# Patient Record
Sex: Female | Born: 1960 | Race: White | Hispanic: No | Smoking: Current every day smoker
Health system: Southern US, Community
[De-identification: ages and names within clinical notes are randomized; demographics above are authoritative.]

## PROBLEM LIST (undated history)

## (undated) DIAGNOSIS — E039 Hypothyroidism, unspecified: Secondary | ICD-10-CM

## (undated) DIAGNOSIS — N3010 Interstitial cystitis (chronic) without hematuria: Secondary | ICD-10-CM

## (undated) DIAGNOSIS — C801 Malignant (primary) neoplasm, unspecified: Secondary | ICD-10-CM

## (undated) DIAGNOSIS — C73 Malignant neoplasm of thyroid gland: Secondary | ICD-10-CM

## (undated) DIAGNOSIS — R112 Nausea with vomiting, unspecified: Secondary | ICD-10-CM

## (undated) DIAGNOSIS — Z9889 Other specified postprocedural states: Secondary | ICD-10-CM

## (undated) HISTORY — PX: BLADDER SURGERY: SHX569

## (undated) HISTORY — PX: THYROID SURGERY: SHX805

## (undated) HISTORY — PX: COLONOSCOPY: SHX174

## (undated) HISTORY — PX: CERVICAL CONIZATION W/BX: SHX1330

---

## 1998-04-02 ENCOUNTER — Ambulatory Visit (HOSPITAL_COMMUNITY): Admission: RE | Admit: 1998-04-02 | Discharge: 1998-04-02 | Payer: Self-pay | Admitting: Obstetrics & Gynecology

## 2001-04-09 ENCOUNTER — Other Ambulatory Visit: Admission: RE | Admit: 2001-04-09 | Discharge: 2001-04-09 | Payer: Self-pay | Admitting: Obstetrics & Gynecology

## 2002-06-09 ENCOUNTER — Other Ambulatory Visit: Admission: RE | Admit: 2002-06-09 | Discharge: 2002-06-09 | Payer: Self-pay | Admitting: Obstetrics & Gynecology

## 2002-12-18 ENCOUNTER — Encounter: Admission: RE | Admit: 2002-12-18 | Discharge: 2002-12-18 | Payer: Self-pay | Admitting: Family Medicine

## 2002-12-18 ENCOUNTER — Encounter: Payer: Self-pay | Admitting: Family Medicine

## 2003-01-10 ENCOUNTER — Encounter: Payer: Self-pay | Admitting: Family Medicine

## 2003-01-10 ENCOUNTER — Encounter: Admission: RE | Admit: 2003-01-10 | Discharge: 2003-01-10 | Payer: Self-pay | Admitting: Family Medicine

## 2003-07-21 ENCOUNTER — Other Ambulatory Visit: Admission: RE | Admit: 2003-07-21 | Discharge: 2003-07-21 | Payer: Self-pay | Admitting: Obstetrics & Gynecology

## 2004-09-20 ENCOUNTER — Other Ambulatory Visit: Admission: RE | Admit: 2004-09-20 | Discharge: 2004-09-20 | Payer: Self-pay | Admitting: Obstetrics & Gynecology

## 2005-09-26 ENCOUNTER — Other Ambulatory Visit: Admission: RE | Admit: 2005-09-26 | Discharge: 2005-09-26 | Payer: Self-pay | Admitting: Obstetrics & Gynecology

## 2006-01-25 ENCOUNTER — Encounter (INDEPENDENT_AMBULATORY_CARE_PROVIDER_SITE_OTHER): Payer: Self-pay | Admitting: *Deleted

## 2006-01-25 ENCOUNTER — Ambulatory Visit (HOSPITAL_COMMUNITY): Admission: RE | Admit: 2006-01-25 | Discharge: 2006-01-25 | Payer: Self-pay | Admitting: Obstetrics & Gynecology

## 2007-03-30 ENCOUNTER — Emergency Department (HOSPITAL_COMMUNITY): Admission: EM | Admit: 2007-03-30 | Discharge: 2007-03-31 | Payer: Self-pay | Admitting: Emergency Medicine

## 2008-08-31 ENCOUNTER — Encounter: Admission: RE | Admit: 2008-08-31 | Discharge: 2008-08-31 | Payer: Self-pay | Admitting: *Deleted

## 2009-05-25 ENCOUNTER — Ambulatory Visit (HOSPITAL_BASED_OUTPATIENT_CLINIC_OR_DEPARTMENT_OTHER): Admission: RE | Admit: 2009-05-25 | Discharge: 2009-05-25 | Payer: Self-pay | Admitting: Urology

## 2010-12-23 NOTE — Op Note (Signed)
NAMEVENICIA, VANDALL                  ACCOUNT NO.:  [id number]   MEDICAL RECORD NO.:  [id number]          PATIENT TYPE:  AMB   LOCATION:  [facility]C                           FACILITY:  [facility]   PHYSICIAN:  Gerrit Friends. Aldona Bar, M.D.   DATE OF BIRTH:  1960/08/29   DATE OF PROCEDURE:  01/25/2006  DATE OF DISCHARGE:                                 OPERATIVE REPORT   PREOPERATIVE DIAGNOSIS:  Vulvar intraepithelial neoplasia, III.   POSTOP DIAGNOSES:  Vulvar intraepithelial neoplasia, III, pathology pending.   PROCEDURE:  Excision of vulvar lesion.   SURGEON:  Gerrit Friends. Aldona Bar, M.D.   ANESTHESIA:  General with local anesthesia of 1% Xylocaine with epinephrine.   HISTORY:  This 50 year old female, when seen for her annual exam  approximately 4 months ago, noted a lesion just lateral to the right labia  majora that was very itchy, and was treated with topical antifungal.  She  was asked to return for further evaluation, which she did approximately 2 or  3 weeks ago; and the lesion was still present.  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  She is now taken to the operating room for  complete excision of the lesion.   DESCRIPTION OF PROCEDURE:  The patient was taken to the operating room,  where after the satisfactory induction of general anesthesia, she was  prepped and draped having been placed in the short Allen stirrups in the  modified lithotomy position.  Using a marking pen, the lesion was completely  circumscribed; and, thereafter, using a scalpel the lesion was totally  excised--full-thickness.  The lesion was sent to pathology with a suture at  12 o'clock.  Closure of the defect was then carried out with 3-0 Vicryl in  an interrupted fashion to bring together the subcutaneous tissue and 3-0  silk in an interrupted fashion to close skin.  The overall size of the  lesion was approximately 2 x 1 cm.   At the conclusion of the procedure the patient was transported to recovery  room in satisfactory addition.  She will be discharged to home.  She will  care for the lesion as instructed; will return to the office in  approximately one weeks' time for suture removal; and will use Darvocet N  100 one q.4 h. as needed for discomfort.  Condition on arrival to recovery  room satisfactory.      Gerrit Friends. Aldona Bar, M.D.  Electronically Signed     RMW/MEDQ  D:  01/25/2006  T:  01/25/2006  Job:  [id number]

## 2011-02-15 ENCOUNTER — Other Ambulatory Visit: Payer: Self-pay | Admitting: Obstetrics and Gynecology

## 2013-07-22 ENCOUNTER — Other Ambulatory Visit: Payer: Self-pay

## 2013-09-10 ENCOUNTER — Other Ambulatory Visit: Payer: Self-pay | Admitting: Physician Assistant

## 2013-09-10 ENCOUNTER — Ambulatory Visit
Admission: RE | Admit: 2013-09-10 | Discharge: 2013-09-10 | Disposition: A | Discharge status: Home / Self Care | Payer: 59 | Source: Ambulatory Visit | Attending: Physician Assistant | Admitting: Physician Assistant

## 2013-09-10 DIAGNOSIS — R52 Pain, unspecified: Secondary | ICD-10-CM

## 2013-09-11 ENCOUNTER — Other Ambulatory Visit: Payer: Self-pay | Admitting: Physician Assistant

## 2014-04-28 ENCOUNTER — Ambulatory Visit (INDEPENDENT_AMBULATORY_CARE_PROVIDER_SITE_OTHER): Payer: 59 | Admitting: Podiatry

## 2014-04-28 ENCOUNTER — Ambulatory Visit (INDEPENDENT_AMBULATORY_CARE_PROVIDER_SITE_OTHER): Payer: 59

## 2014-04-28 ENCOUNTER — Encounter: Payer: Self-pay | Admitting: Podiatry

## 2014-04-28 VITALS — BP 120/65 | HR 61 | Resp 16 | Ht 66.0 in | Wt 169.0 lb

## 2014-04-28 DIAGNOSIS — M2012 Hallux valgus (acquired), left foot: Secondary | ICD-10-CM

## 2014-04-28 DIAGNOSIS — M21619 Bunion of unspecified foot: Secondary | ICD-10-CM

## 2014-04-28 DIAGNOSIS — M21629 Bunionette of unspecified foot: Secondary | ICD-10-CM

## 2014-04-28 DIAGNOSIS — M2010 Hallux valgus (acquired), unspecified foot: Secondary | ICD-10-CM

## 2014-04-28 NOTE — Patient Instructions (Signed)

## 2014-04-28 NOTE — Progress Notes (Signed)
   Subjective:    Patient ID: Bethany Kirby, female    DOB: 02-05-1961, 53 y.o.   MRN: [id number]  HPI Comments: "I have a bump at this joint"  Patient c/o aching 1st MPJ left for about 6 months. The area swells and gets red. She is having trouble wearing comfortable shoes. Tried softer shoes and Advil.  Foot Pain Associated symptoms include arthralgias, coughing and a sore throat.      Review of Systems  HENT: Positive for sinus pressure and sore throat.   Respiratory: Positive for cough and chest tightness.   Musculoskeletal: Positive for arthralgias.  All other systems reviewed and are negative.      Objective:   Physical Exam: I have reviewed her past medical history medications allergies surgeries social history and review of systems. Pulses are strongly palpable bilateral. Neurologic sensorium is intact per since once the monofilament. Deep tendon reflexes are intact bilateral muscle strength is 5 over 5 dorsiflexors plantar flexors inverters everters all intrinsic musculature is intact. Orthopedic evaluation demonstrates hallux abductovalgus deformity to the bilateral feet left greater than right. She has pain on range of motion of the first metatarsophalangeal joint she also has Taylor's bunion deformity bilateral left greater than right. She has pain on palpation of the fifth metatarsophalangeal joint as well as range of motion. Radiographic evaluation demonstrates early osteoarthritic process first metatarsophalangeal joint right foot with an increase in the first intermetatarsal angle greater than normal value hallux abductus angle is greater than normal value as well and demonstrates early dorsal spurring. An increase in the fourth intermetatarsal space is indicative of a Taylor's bunion deformity with mild abductus of the fifth toe. Cutaneous evaluation demonstrates supple while hydrated cutis mild erythema overlying the first metatarsophalangeal joint left.        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  Plan: Discussed etiology pathology conservative versus surgical therapies at this point in time the patient is requesting surgical intervention. Surgical intervention would consist of a Austin bunion repair with screw fixation and a fifth metatarsal osteotomy with screw fixation. We went over consent form today line bylined number by number giving her ample time to ask questions she saw fit regarding an Austin bunion repair and a fifth metatarsal osteotomy. I answered all the questions regarding his procedures to the rest of my ability regarding her left foot. We did go over the possible postop complications which may consist of but are not limited to postop pain bleeding swelling infection need for further surgery also digit also muscles wife overcorrection and correction. She understands that is amenable to it and will followup with me in the near future for surgical intervention.

## 2014-05-18 ENCOUNTER — Encounter (HOSPITAL_COMMUNITY): Payer: Self-pay | Admitting: *Deleted

## 2014-05-18 ENCOUNTER — Encounter (HOSPITAL_COMMUNITY): Payer: Self-pay | Admitting: Pharmacist

## 2014-05-27 ENCOUNTER — Other Ambulatory Visit: Payer: Self-pay | Admitting: Obstetrics and Gynecology

## 2014-05-29 ENCOUNTER — Encounter (HOSPITAL_COMMUNITY): Payer: Self-pay | Admitting: *Deleted

## 2014-05-29 ENCOUNTER — Encounter (HOSPITAL_COMMUNITY)
Admission: RE | Disposition: A | Discharge status: Home / Self Care | Payer: Self-pay | Source: Ambulatory Visit | Attending: Obstetrics and Gynecology

## 2014-05-29 ENCOUNTER — Encounter (HOSPITAL_COMMUNITY): Payer: 59 | Admitting: Anesthesiology

## 2014-05-29 ENCOUNTER — Ambulatory Visit (HOSPITAL_COMMUNITY)
Admission: RE | Admit: 2014-05-29 | Discharge: 2014-05-29 | Disposition: A | Discharge status: Home / Self Care | Payer: 59 | Source: Ambulatory Visit | Attending: Obstetrics and Gynecology | Admitting: Obstetrics and Gynecology

## 2014-05-29 ENCOUNTER — Ambulatory Visit (HOSPITAL_COMMUNITY): Payer: 59 | Admitting: Anesthesiology

## 2014-05-29 DIAGNOSIS — E039 Hypothyroidism, unspecified: Secondary | ICD-10-CM | POA: Insufficient documentation

## 2014-05-29 DIAGNOSIS — D250 Submucous leiomyoma of uterus: Secondary | ICD-10-CM | POA: Diagnosis not present

## 2014-05-29 DIAGNOSIS — N920 Excessive and frequent menstruation with regular cycle: Secondary | ICD-10-CM | POA: Diagnosis not present

## 2014-05-29 DIAGNOSIS — F17210 Nicotine dependence, cigarettes, uncomplicated: Secondary | ICD-10-CM | POA: Diagnosis not present

## 2014-05-29 DIAGNOSIS — Z85850 Personal history of malignant neoplasm of thyroid: Secondary | ICD-10-CM | POA: Insufficient documentation

## 2014-05-29 HISTORY — PX: DILITATION & CURRETTAGE/HYSTROSCOPY WITH NOVASURE ABLATION: SHX5568

## 2014-05-29 HISTORY — DX: Nausea with vomiting, unspecified: R11.2

## 2014-05-29 HISTORY — DX: Malignant neoplasm of thyroid gland: C73

## 2014-05-29 HISTORY — DX: Interstitial cystitis (chronic) without hematuria: N30.10

## 2014-05-29 HISTORY — DX: Hypothyroidism, unspecified: E03.9

## 2014-05-29 HISTORY — DX: Malignant (primary) neoplasm, unspecified: C80.1

## 2014-05-29 HISTORY — DX: Other specified postprocedural states: Z98.890

## 2014-05-29 LAB — CBC
HCT: 39.6 % (ref 36.0–46.0)
HEMOGLOBIN: 13.3 g/dL (ref 12.0–15.0)
MCH: 29.8 pg (ref 26.0–34.0)
MCHC: 33.6 g/dL (ref 30.0–36.0)
MCV: 88.6 fL (ref 78.0–100.0)
Platelets: 235 10*3/uL (ref 150–400)
RBC: 4.47 MIL/uL (ref 3.87–5.11)
RDW: 13.4 % (ref 11.5–15.5)
WBC: 4.3 10*3/uL (ref 4.0–10.5)

## 2014-05-29 LAB — PREGNANCY, URINE: PREG TEST UR: NEGATIVE

## 2014-05-29 SURGERY — DILATATION & CURETTAGE/HYSTEROSCOPY WITH NOVASURE ABLATION
Anesthesia: General | Site: Vagina

## 2014-05-29 MED ORDER — OXYCODONE-ACETAMINOPHEN 5-325 MG PO TABS
1.0000 | ORAL_TABLET | Freq: Once | ORAL | Status: AC
  Administered 2014-05-29: 1 via ORAL

## 2014-05-29 MED ORDER — ONDANSETRON HCL 4 MG/2ML IJ SOLN
INTRAMUSCULAR | Status: AC
Start: 2014-05-29 — End: 2014-05-29
  Filled 2014-05-29: qty 2

## 2014-05-29 MED ORDER — OXYCODONE-ACETAMINOPHEN 5-325 MG PO TABS: 1.0000 | ORAL_TABLET | ORAL | Status: DC | PRN

## 2014-05-29 MED ORDER — SCOPOLAMINE 1 MG/3DAYS TD PT72
MEDICATED_PATCH | TRANSDERMAL | Status: AC
  Administered 2014-05-29: 1.5 mg via TRANSDERMAL
  Filled 2014-05-29: qty 1

## 2014-05-29 MED ORDER — MIDAZOLAM HCL 2 MG/2ML IJ SOLN
INTRAMUSCULAR | Status: AC
  Filled 2014-05-29: qty 2

## 2014-05-29 MED ORDER — LIDOCAINE HCL (CARDIAC) 20 MG/ML IV SOLN
INTRAVENOUS | Status: DC | PRN
  Administered 2014-05-29: 50 mg via INTRAVENOUS

## 2014-05-29 MED ORDER — EPHEDRINE 5 MG/ML INJ
INTRAVENOUS | Status: AC
  Filled 2014-05-29: qty 10

## 2014-05-29 MED ORDER — ONDANSETRON HCL 4 MG/2ML IJ SOLN
INTRAMUSCULAR | Status: DC | PRN
Start: 2014-05-29 — End: 2014-05-29
  Administered 2014-05-29: 4 mg via INTRAVENOUS

## 2014-05-29 MED ORDER — PROPOFOL 10 MG/ML IV BOLUS
INTRAVENOUS | Status: DC | PRN
  Administered 2014-05-29: 150 mg via INTRAVENOUS

## 2014-05-29 MED ORDER — FENTANYL CITRATE 0.05 MG/ML IJ SOLN
25.0000 ug | INTRAMUSCULAR | Status: DC | PRN
  Administered 2014-05-29: 50 ug via INTRAVENOUS

## 2014-05-29 MED ORDER — LIDOCAINE HCL 1 % IJ SOLN
INTRAMUSCULAR | Status: DC | PRN
  Administered 2014-05-29: 10 mL

## 2014-05-29 MED ORDER — GLYCOPYRROLATE 0.2 MG/ML IJ SOLN
INTRAMUSCULAR | Status: DC | PRN
  Administered 2014-05-29: 0.2 mg via INTRAVENOUS

## 2014-05-29 MED ORDER — EPHEDRINE SULFATE 50 MG/ML IJ SOLN
INTRAMUSCULAR | Status: DC | PRN
  Administered 2014-05-29 (×2): 10 mg via INTRAVENOUS

## 2014-05-29 MED ORDER — LACTATED RINGERS IV SOLN
INTRAVENOUS | Status: DC
  Administered 2014-05-29 (×2): via INTRAVENOUS

## 2014-05-29 MED ORDER — MEPERIDINE HCL 25 MG/ML IJ SOLN: 6.2500 mg | INTRAMUSCULAR | Status: DC | PRN

## 2014-05-29 MED ORDER — DEXAMETHASONE SODIUM PHOSPHATE 10 MG/ML IJ SOLN
INTRAMUSCULAR | Status: DC | PRN
  Administered 2014-05-29: 4 mg via INTRAVENOUS

## 2014-05-29 MED ORDER — GLYCOPYRROLATE 0.2 MG/ML IJ SOLN
INTRAMUSCULAR | Status: AC
  Filled 2014-05-29: qty 1

## 2014-05-29 MED ORDER — LACTATED RINGERS IR SOLN
Status: DC | PRN
  Administered 2014-05-29: 3000 mL

## 2014-05-29 MED ORDER — PROMETHAZINE HCL 25 MG/ML IJ SOLN: 6.2500 mg | INTRAMUSCULAR | Status: DC | PRN

## 2014-05-29 MED ORDER — KETOROLAC TROMETHAMINE 30 MG/ML IJ SOLN
INTRAMUSCULAR | Status: DC | PRN
  Administered 2014-05-29: 30 mg via INTRAVENOUS

## 2014-05-29 MED ORDER — FENTANYL CITRATE 0.05 MG/ML IJ SOLN
INTRAMUSCULAR | Status: AC
  Administered 2014-05-29: 50 ug via INTRAVENOUS
  Filled 2014-05-29: qty 2

## 2014-05-29 MED ORDER — FENTANYL CITRATE 0.05 MG/ML IJ SOLN
INTRAMUSCULAR | Status: DC | PRN
  Administered 2014-05-29 (×2): 50 ug via INTRAVENOUS

## 2014-05-29 MED ORDER — PROPOFOL 10 MG/ML IV EMUL
INTRAVENOUS | Status: AC
  Filled 2014-05-29: qty 20

## 2014-05-29 MED ORDER — LIDOCAINE HCL (CARDIAC) 20 MG/ML IV SOLN
INTRAVENOUS | Status: AC
  Filled 2014-05-29: qty 5

## 2014-05-29 MED ORDER — DEXAMETHASONE SODIUM PHOSPHATE 4 MG/ML IJ SOLN
INTRAMUSCULAR | Status: AC
  Filled 2014-05-29: qty 1

## 2014-05-29 MED ORDER — MIDAZOLAM HCL 2 MG/2ML IJ SOLN
INTRAMUSCULAR | Status: DC | PRN
  Administered 2014-05-29: 2 mg via INTRAVENOUS

## 2014-05-29 MED ORDER — OXYCODONE-ACETAMINOPHEN 5-325 MG PO TABS: 1.0000 | ORAL_TABLET | ORAL | Status: AC | PRN

## 2014-05-29 MED ORDER — KETOROLAC TROMETHAMINE 30 MG/ML IJ SOLN: 15.0000 mg | Freq: Once | INTRAMUSCULAR | Status: DC | PRN

## 2014-05-29 MED ORDER — KETOROLAC TROMETHAMINE 30 MG/ML IJ SOLN
INTRAMUSCULAR | Status: AC
  Filled 2014-05-29: qty 1

## 2014-05-29 MED ORDER — OXYCODONE-ACETAMINOPHEN 5-325 MG PO TABS
ORAL_TABLET | ORAL | Status: AC
  Filled 2014-05-29: qty 1

## 2014-05-29 MED ORDER — SCOPOLAMINE 1 MG/3DAYS TD PT72
1.0000 | MEDICATED_PATCH | Freq: Once | TRANSDERMAL | Status: DC
  Administered 2014-05-29: 1.5 mg via TRANSDERMAL

## 2014-05-29 MED ORDER — LIDOCAINE HCL 1 % IJ SOLN
INTRAMUSCULAR | Status: AC
  Filled 2014-05-29: qty 20

## 2014-05-29 MED ORDER — FENTANYL CITRATE 0.05 MG/ML IJ SOLN
INTRAMUSCULAR | Status: AC
  Filled 2014-05-29: qty 2

## 2014-05-29 SURGICAL SUPPLY — 16 items
ABLATOR ENDOMETRIAL BIPOLAR (ABLATOR) ×1 IMPLANT
CANISTER SUCT 3000ML (MISCELLANEOUS) ×2 IMPLANT
CATH ROBINSON RED A/P 16FR (CATHETERS) ×2 IMPLANT
CLOTH BEACON ORANGE TIMEOUT ST (SAFETY) ×2 IMPLANT
CONTAINER PREFILL 10% NBF 60ML (FORM) ×4 IMPLANT
DRAPE HYSTEROSCOPY (DRAPE) ×2 IMPLANT
GLOVE BIOGEL PI IND STRL 6.5 (GLOVE) ×2 IMPLANT
GLOVE BIOGEL PI INDICATOR 6.5 (GLOVE) ×2
GLOVE ECLIPSE 6.5 STRL STRAW (GLOVE) ×2 IMPLANT
GOWN STRL REUS W/TWL LRG LVL3 (GOWN DISPOSABLE) ×4 IMPLANT
PACK VAGINAL MINOR WOMEN LF (CUSTOM PROCEDURE TRAY) ×2 IMPLANT
PAD OB MATERNITY 4.3X12.25 (PERSONAL CARE ITEMS) ×2 IMPLANT
SET TUBING HYSTEROSCOPY 2 NDL (TUBING) ×1 IMPLANT
TOWEL OR 17X24 6PK STRL BLUE (TOWEL DISPOSABLE) ×4 IMPLANT
TUBE HYSTEROSCOPY W Y-CONNECT (TUBING) ×1 IMPLANT
WATER STERILE IRR 1000ML POUR (IV SOLUTION) ×2 IMPLANT

## 2014-05-29 NOTE — Brief Op Note (Addendum)
05/29/2014  2:49 PM  PATIENT:  Bethany Kirby  53 y.o. female  PRE-OPERATIVE DIAGNOSIS:  MENORRHAGIA  POST-OPERATIVE DIAGNOSIS:  MENORRHAGIA  PROCEDURE:  Procedure(s): DILATATION & CURETTAGE/HYSTEROSCOPY WITH ATTEMPTED NOVASURE ABLATION (N/A)- unable to complete to due device test failure  SURGEON:  Surgeon(s) and Role:    * Allyn Kenner, DO - Primary  ANESTHESIA:   general  EBL:  Total I/O In: 1200 [I.V.:1200] Out: 80 [Urine:75; Blood:5]  BLOOD ADMINISTERED:none  DRAINS: none   LOCAL MEDICATIONS USED:  LIDOCAINE   SPECIMEN:  Source of Specimen:  emc  DISPOSITION OF SPECIMEN:  PATHOLOGY  PLAN OF CARE: Discharge to home after PACU  PATIENT DISPOSITION:  PACU - hemodynamically stable.   Delay start of Pharmacological VTE agent (>24hrs) due to surgical blood loss or risk of bleeding: no

## 2014-05-29 NOTE — Transfer of Care (Signed)
Immediate Anesthesia Transfer of Care Note  Patient: Bethany Kirby  Procedure(s) Performed: Procedure(s): DILATATION & CURETTAGE/HYSTEROSCOPY WITH ATTEMPTED NOVASURE ABLATION (N/A)  Patient Location: PACU  Anesthesia Type:General  Level of Consciousness: awake, alert  and oriented  Airway & Oxygen Therapy: Patient Spontanous Breathing and Patient connected to nasal cannula oxygen  Post-op Assessment: Report given to PACU RN and Post -op Vital signs reviewed and stable  Post vital signs: Reviewed and stable  Complications: No apparent anesthesia complications

## 2014-05-29 NOTE — Anesthesia Preprocedure Evaluation (Signed)
Anesthesia Evaluation  Patient identified by MRN, date of birth, ID band Patient awake    Reviewed: Allergy & Precautions, H&P , NPO status , Patient's Chart, lab work & pertinent test results, reviewed documented beta blocker date and time   Airway Mallampati: I TM Distance: >3 FB Neck ROM: full    Dental no notable dental hx. (+) Teeth Intact   Pulmonary Current Smoker,    Pulmonary exam normal       Cardiovascular negative cardio ROS      Neuro/Psych negative neurological ROS  negative psych ROS   GI/Hepatic negative GI ROS, Neg liver ROS,   Endo/Other    Renal/GU negative Renal ROS     Musculoskeletal   Abdominal Normal abdominal exam  (+)   Peds  Hematology negative hematology ROS (+)   Anesthesia Other Findings   Reproductive/Obstetrics negative OB ROS                           Anesthesia Physical Anesthesia Plan  ASA: II  Anesthesia Plan: General   Post-op Pain Management:    Induction: Intravenous  Airway Management Planned: LMA  Additional Equipment:   Intra-op Plan:   Post-operative Plan:   Informed Consent: I have reviewed the patients History and Physical, chart, labs and discussed the procedure including the risks, benefits and alternatives for the proposed anesthesia with the patient or authorized representative who has indicated his/her understanding and acceptance.     Plan Discussed with: CRNA, Surgeon and Anesthesiologist  Anesthesia Plan Comments:         Anesthesia Quick Evaluation

## 2014-05-29 NOTE — H&P (Signed)
53 y.o. complains of abnormal uterine bleeding, recent US showed approx 7 x 5 x 3cm uterus with <42mm EMS and small fibroid measuring <3cm.    Past Medical History  Diagnosis Date  . IC (interstitial cystitis)   . Cancer     thyroid  . PONV (postoperative nausea and vomiting)   . Hypothyroidism   . Thyroid cancer    Past Surgical History  Procedure Laterality Date  . Thyroid surgery    . Cervical conization w/bx    . Bladder surgery    . Colonoscopy      History   Social History  . Marital Status: Divorced    Spouse Name: N/A    Number of Children: N/A  . Years of Education: N/A   Occupational History  . Not on file.   Social History Main Topics  . Smoking status: Current Every Day Smoker -- 0.25 packs/day    Types: Cigarettes  . Smokeless tobacco: Not on file  . Alcohol Use: Yes     Comment: rarely  . Drug Use: No  . Sexual Activity: Yes    Birth Control/ Protection: None   Other Topics Concern  . Not on file   Social History Narrative  . No narrative on file    No current facility-administered medications on file prior to encounter.   No current outpatient prescriptions on file prior to encounter.    No Known Allergies  @VITALS2 @  Lungs: clear to ascultation Cor:  RRR Abdomen:  soft, nontender, nondistended. Ex:  no cords, erythema Pelvic:  Def to OR  A:  Admit for schedule D&C hysterscopy with Novasure endometrial ablation   P:   All risks, benefits and alternatives d/w patient and she desires to proceed. Specifically discussed that typically EMB or D&C is done for tissue sampling prior to [facility] to ensure than no cell abnormalities/cancer are present.  Pt wants both done on same day as cannot afford multiple surgeries.   Routine pre-op care. Allyn Kenner

## 2014-05-29 NOTE — Anesthesia Postprocedure Evaluation (Signed)
  Anesthesia Post-op Note  Patient: Bethany Kirby  Procedure(s) Performed: Procedure(s): DILATATION & CURETTAGE/HYSTEROSCOPY WITH ATTEMPTED NOVASURE ABLATION (N/A)  Patient Location: PACU  Anesthesia Type:General  Level of Consciousness: awake, alert  and oriented  Airway and Oxygen Therapy: Patient Spontanous Breathing  Post-op Pain: mild  Post-op Assessment: Post-op Vital signs reviewed, Patient's Cardiovascular Status Stable, Respiratory Function Stable, Patent Airway, No signs of Nausea or vomiting and Pain level controlled  Post-op Vital Signs: Reviewed and stable  Last Vitals:  Filed Vitals:   05/29/14 1545  BP: 112/66  Pulse: 74  Temp:   Resp: 14    Complications: No apparent anesthesia complications

## 2014-05-29 NOTE — Discharge Instructions (Signed)
DISCHARGE INSTRUCTIONS: HYSTEROSCOPY / ENDOMETRIAL ABLATION The following instructions have been prepared to help you care for yourself upon your return home.  MAY TAKE IBUPROFEN/MOTRIN/ADVIL/ALEVE AFTER 8:30 P.M. AS NEEDED FOR PAIN  Personal hygiene:  Use sanitary pads for vaginal drainage, not tampons.  Shower the day after your procedure.  NO tub baths, pools or Jacuzzis for 2-3 weeks.  Wipe front to back after using the bathroom.  Activity and limitations:  Do NOT drive or operate any equipment for 24 hours. The effects of anesthesia are still present and drowsiness may result.  Do NOT rest in bed all day.  Walking is encouraged.  Walk up and down stairs slowly.  You may resume your normal activity in one to two days or as indicated by your physician.  Sexual activity: NO intercourse for at least 2 weeks after the procedure, or as indicated by your Doctor.  Diet: Eat a light meal as desired this evening. You may resume your usual diet tomorrow.  Return to Work: You may resume your work activities in one to two days or as indicated by Marine scientist.  What to expect after your surgery: Expect to have vaginal bleeding/discharge for 2-3 days and spotting for up to 10 days. It is not unusual to have soreness for up to 1-2 weeks. You may have a slight burning sensation when you urinate for the first day. Mild cramps may continue for a couple of days. You may have a regular period in 2-6 weeks.  Call your doctor for any of the following:  Excessive vaginal bleeding or clotting, saturating and changing one pad every hour.  Inability to urinate 6 hours after discharge from hospital.  Pain not relieved by pain medication.  Fever of 100.4 F or greater.  Unusual vaginal discharge or odor.  [facility] [phone]

## 2014-05-30 NOTE — Op Note (Signed)
Bethany Kirby, Bethany Kirby                  ACCOUNT NO.:  [id number]  MEDICAL RECORD NO.:  [id number]  LOCATION:  [facility]                          FACILITY:  [facility]  PHYSICIAN:  Allyn Kenner, DO    DATE OF BIRTH:  [birth year]  DATE OF PROCEDURE:  05/29/2014 DATE OF DISCHARGE:  05/29/2014                              OPERATIVE REPORT   PREOPERATIVE DIAGNOSIS:  Menorrhagia.  POSTOPERATIVE DIAGNOSES:  Menorrhagia; posterior fibroids, submucosal, impinging on endometrial cavity.  PROCEDURE:  Dilation and curettage and hysteroscopy with attempted NovaSure which was not completed due to device test failure.  SURGEON:  Allyn Kenner, DO.  ANESTHESIA:  General.  IV FLUIDS:  1200 mL.  URINE OUTPUT:  75 mL.  ESTIMATED BLOOD LOSS:  5 mL.  LOCAL MEDICATIONS:  10 mL of 1% lidocaine for paracervical block.  SPECIMENS:  Endometrial curettings to pathology.  FINDINGS:  Uterus sounded to approximately 7 cm.  On initial hysteroscopic evaluation, uterine cavity appeared normal.  Cervical length was found to be approximately 3 cm leaving a residual uterine cavity length of 4 cm which was the minimum allowable for NovaSure device.  DESCRIPTION OF PROCEDURE:  The patient was taken to the operating room where anesthesia was administered and found to be adequate.  She was prepped and draped in a normal sterile fashion in dorsal lithotomy position.  The patient was catheterized and weighted speculum was placed in the posterior aspect of the vagina.  Sims retractor was placed in the anterior aspect and cervix was visualized and grasped with a single- tooth tenaculum.  Lidocaine paracervical block was placed and the cervix was entered with sound with the uterus sounded to approximately 7 cm. Cervix was serially dilated.  The hysteroscope was entered.  Uterine cavity appeared normal with a slight bulge of possible fibroids from the posterior aspect of the uterus.  The cervical length was measured and the  cervical canal was then further dilated to accommodate the NovaSure device.  The NovaSure device was easily entered and deployed.  Uterine cavity width was originally insufficient with being less than 2.5 cm. The NovaSure device was removed.  Curettage of all 4 quadrants was performed with specimen sent to pathology.  The NovaSure device was re- entered and the cavity was found to be approximately 2.5 at the threshold of width allowable for NovaSure.  Tested device was performed and failure noted.  Second test was performed and failure noted. NovaSure device was removed and a second pass with endometrial curettage with assessment of uterine cavity performed using curette.  The device was re-entered and it was deployed and the width of the cavity was found to be 3 cm.  Again, the device was tested and failed, tested a second time and failed.  At this point, abandoning the NovaSure procedure was decided upon.  A final view of the hysteroscope was performed and it appeared that the fibroid was more prominent than when initially assessed.  All instruments were removed from the uterus and vagina. Single-tooth tenaculum sites were hemostatic.  The patient tolerated the procedure well and taken to recovery in stable condition.          ______________________________ Casimer Bilis  Rogue Bussing, DO     [place]/MEDQ  D:  05/30/2014  T:  05/30/2014  Job:  [id number]

## 2014-06-01 ENCOUNTER — Encounter (HOSPITAL_COMMUNITY): Payer: Self-pay | Admitting: Obstetrics and Gynecology

## 2014-06-04 ENCOUNTER — Other Ambulatory Visit: Payer: Self-pay | Admitting: Podiatry

## 2014-06-04 MED ORDER — PROMETHAZINE HCL 25 MG PO TABS: 25.0000 mg | ORAL_TABLET | Freq: Three times a day (TID) | ORAL | Status: AC | PRN

## 2014-06-04 MED ORDER — CEPHALEXIN 500 MG PO CAPS: 500.0000 mg | ORAL_CAPSULE | Freq: Three times a day (TID) | ORAL | Status: AC

## 2014-06-04 MED ORDER — OXYCODONE-ACETAMINOPHEN 10-325 MG PO TABS: 1.0000 | ORAL_TABLET | Freq: Four times a day (QID) | ORAL | Status: AC | PRN

## 2014-06-05 ENCOUNTER — Encounter: Payer: Self-pay | Admitting: Podiatry

## 2014-06-05 DIAGNOSIS — M2012 Hallux valgus (acquired), left foot: Secondary | ICD-10-CM

## 2014-06-05 DIAGNOSIS — M21542 Acquired clubfoot, left foot: Secondary | ICD-10-CM

## 2014-06-06 ENCOUNTER — Telehealth: Payer: Self-pay

## 2014-06-06 NOTE — Telephone Encounter (Signed)
Spoke with patient regarding post operative status, she stated that she was doing well, the numbness has worn off but she is taking her pain medications as scheduled. Advised to remain in boot, keep dressing dry and intact, ice and elevate. Advised patient on signs and symptoms of infection, fever, chills, n/v to been seen immediately.

## 2014-06-11 ENCOUNTER — Encounter: Payer: Self-pay | Admitting: Podiatry

## 2014-06-11 ENCOUNTER — Ambulatory Visit (INDEPENDENT_AMBULATORY_CARE_PROVIDER_SITE_OTHER): Payer: 59 | Admitting: Podiatry

## 2014-06-11 ENCOUNTER — Ambulatory Visit (INDEPENDENT_AMBULATORY_CARE_PROVIDER_SITE_OTHER): Payer: 59

## 2014-06-11 VITALS — BP 115/81 | HR 77 | Temp 97.6°F | Resp 16

## 2014-06-11 DIAGNOSIS — Z9889 Other specified postprocedural states: Secondary | ICD-10-CM

## 2014-06-11 DIAGNOSIS — M21612 Bunion of left foot: Secondary | ICD-10-CM

## 2014-06-11 DIAGNOSIS — M205X9 Other deformities of toe(s) (acquired), unspecified foot: Secondary | ICD-10-CM

## 2014-06-11 DIAGNOSIS — M2012 Hallux valgus (acquired), left foot: Secondary | ICD-10-CM

## 2014-06-11 DIAGNOSIS — M21629 Bunionette of unspecified foot: Secondary | ICD-10-CM

## 2014-06-11 NOTE — Progress Notes (Signed)
She presents today for her first postop visit status post ostomy answered bunion repair with screw first metatarsal left foot. And a fifth metatarsal osteotomy with screw left foot. She states is a little swollen today and some pain is present. She denies fever chills nausea and vomiting.  Objective: I'll signs are stable she is alert and oriented 3. Pulses are palpable today after dry sterile dressing was removed. She has good range of motion of the first metatarsophalangeal joint and a very nice reduction of the fifth metatarsal tailor's bunion deformity. Radiographic evaluation demonstrates capital osteotomies in good position with screw fixation. No signs of infection.  Assessment: Well-healing surgical footleft.  Plan: Redress today with a dry sterile compressive dressing and encouraged range of motion exercises. I will follow up with her in one week she is to keep this dry and elevated.

## 2014-06-14 NOTE — Progress Notes (Signed)
Dr Milinda Pointer performed a left [facility] bunion repair with screw placement, left 5th met osteotomy with screw placement on 06/05/14

## 2014-06-18 ENCOUNTER — Ambulatory Visit (INDEPENDENT_AMBULATORY_CARE_PROVIDER_SITE_OTHER): Payer: 59 | Admitting: Podiatry

## 2014-06-18 VITALS — BP 214/87 | HR 78 | Temp 98.3°F | Resp 16

## 2014-06-18 DIAGNOSIS — Z9889 Other specified postprocedural states: Secondary | ICD-10-CM

## 2014-06-19 NOTE — Progress Notes (Signed)
She presents today 2 weeks status post Austin bunion repair left and tailor's bunion deformity with repair left. She denies fever chills nausea vomiting muscle aches and pains.  Objective: Dry sterile dressing intact once removed demonstrate minimal edema no erythema saline as drainage or odor. Sutures were removed today margins remain well coapted.  Assessment: Nonsurgical foot left.  Plan: Put her in a compression anklet today and a darker she will follow up with her in 2 weeks.

## 2014-07-07 ENCOUNTER — Encounter: Payer: Self-pay | Admitting: Podiatry

## 2014-07-07 ENCOUNTER — Ambulatory Visit (INDEPENDENT_AMBULATORY_CARE_PROVIDER_SITE_OTHER): Payer: 59 | Admitting: Podiatry

## 2014-07-07 ENCOUNTER — Ambulatory Visit (INDEPENDENT_AMBULATORY_CARE_PROVIDER_SITE_OTHER): Payer: 59

## 2014-07-07 DIAGNOSIS — M2012 Hallux valgus (acquired), left foot: Secondary | ICD-10-CM

## 2014-07-07 DIAGNOSIS — M21612 Bunion of left foot: Secondary | ICD-10-CM

## 2014-07-07 DIAGNOSIS — Z9889 Other specified postprocedural states: Secondary | ICD-10-CM

## 2014-07-07 NOTE — Progress Notes (Signed)
She presents today for her third postop visit. She states that her toe still has a little numbness in all and is doing much better.  Objective: Vital signs are stable she's alert and oriented 3 pulses are palpable left foot pain. She has good range of motion of the first metatarsophalangeal joint. Palpation of the first metatarsophalangeal joint on the fifth metatarsophalangeal joint. Radiographs demonstrate well healing osteotomies.  Assessment: Well-healing surgical foot left.  Plan: Encouraged her to use her compression dressing and to try to get her foot back into regular shoe gear.

## 2014-07-20 ENCOUNTER — Encounter: Payer: Self-pay | Admitting: Podiatry

## 2014-08-11 ENCOUNTER — Encounter: Payer: 59 | Admitting: Podiatry

## 2014-08-18 ENCOUNTER — Encounter: Payer: Self-pay | Admitting: Podiatry

## 2014-08-18 ENCOUNTER — Ambulatory Visit (INDEPENDENT_AMBULATORY_CARE_PROVIDER_SITE_OTHER): Payer: 59 | Admitting: Podiatry

## 2014-08-18 ENCOUNTER — Ambulatory Visit (INDEPENDENT_AMBULATORY_CARE_PROVIDER_SITE_OTHER): Payer: 59

## 2014-08-18 VITALS — BP 128/60 | HR 72 | Resp 16

## 2014-08-18 DIAGNOSIS — Z9889 Other specified postprocedural states: Secondary | ICD-10-CM

## 2014-08-18 DIAGNOSIS — M2012 Hallux valgus (acquired), left foot: Secondary | ICD-10-CM

## 2014-08-18 DIAGNOSIS — M21612 Bunion of left foot: Secondary | ICD-10-CM

## 2014-08-18 NOTE — Progress Notes (Signed)
She presents today for her 3 month status post [facility] bunion repair follow-up. She states this seems to be doing pretty good.  Objective: Vital signs are stable she is alert and oriented 3 mild edema no erythema saline drainage or odor to the surgical foot. She has a good range of dorsiflexion but limited on plantar flexion. Radiograph confirms good as addition with screw fixation first metatarsal osteotomy.  Assessment: Well-healing surgical foot 3 months out.  Plan: Encouraged range of motion exercises and I will follow-up with her in 6 weeks at which time we may need to consider physical therapy.

## 2014-09-29 ENCOUNTER — Ambulatory Visit: Payer: 59 | Admitting: Podiatry

## 2014-10-12 ENCOUNTER — Other Ambulatory Visit: Payer: Self-pay

## 2014-10-13 LAB — CYTOLOGY - PAP

## 2015-12-01 DIAGNOSIS — Z6826 Body mass index (BMI) 26.0-26.9, adult: Secondary | ICD-10-CM | POA: Diagnosis not present

## 2015-12-01 DIAGNOSIS — Z01419 Encounter for gynecological examination (general) (routine) without abnormal findings: Secondary | ICD-10-CM | POA: Diagnosis not present

## 2015-12-01 DIAGNOSIS — Z1231 Encounter for screening mammogram for malignant neoplasm of breast: Secondary | ICD-10-CM | POA: Diagnosis not present

## 2015-12-02 DIAGNOSIS — R8271 Bacteriuria: Secondary | ICD-10-CM | POA: Diagnosis not present

## 2015-12-08 ENCOUNTER — Other Ambulatory Visit: Payer: Self-pay | Admitting: Obstetrics and Gynecology

## 2015-12-08 DIAGNOSIS — R928 Other abnormal and inconclusive findings on diagnostic imaging of breast: Secondary | ICD-10-CM

## 2015-12-14 ENCOUNTER — Ambulatory Visit
Admission: RE | Admit: 2015-12-14 | Discharge: 2015-12-14 | Disposition: A | Discharge status: Home / Self Care | Payer: BLUE CROSS/BLUE SHIELD | Source: Ambulatory Visit | Attending: Obstetrics and Gynecology | Admitting: Obstetrics and Gynecology

## 2015-12-14 DIAGNOSIS — R928 Other abnormal and inconclusive findings on diagnostic imaging of breast: Secondary | ICD-10-CM

## 2015-12-14 DIAGNOSIS — R922 Inconclusive mammogram: Secondary | ICD-10-CM | POA: Diagnosis not present

## 2016-01-06 DIAGNOSIS — Z85850 Personal history of malignant neoplasm of thyroid: Secondary | ICD-10-CM | POA: Diagnosis not present

## 2016-01-06 DIAGNOSIS — F339 Major depressive disorder, recurrent, unspecified: Secondary | ICD-10-CM | POA: Diagnosis not present

## 2016-01-06 DIAGNOSIS — E039 Hypothyroidism, unspecified: Secondary | ICD-10-CM | POA: Diagnosis not present

## 2016-01-06 DIAGNOSIS — E890 Postprocedural hypothyroidism: Secondary | ICD-10-CM | POA: Diagnosis not present

## 2016-04-12 DIAGNOSIS — R350 Frequency of micturition: Secondary | ICD-10-CM | POA: Diagnosis not present

## 2016-07-11 DIAGNOSIS — Z85850 Personal history of malignant neoplasm of thyroid: Secondary | ICD-10-CM | POA: Diagnosis not present

## 2016-07-11 DIAGNOSIS — E039 Hypothyroidism, unspecified: Secondary | ICD-10-CM | POA: Diagnosis not present

## 2016-07-11 DIAGNOSIS — Z23 Encounter for immunization: Secondary | ICD-10-CM | POA: Diagnosis not present

## 2016-07-11 DIAGNOSIS — Z0000 Encounter for general adult medical examination without abnormal findings: Secondary | ICD-10-CM | POA: Diagnosis not present

## 2016-07-11 DIAGNOSIS — F339 Major depressive disorder, recurrent, unspecified: Secondary | ICD-10-CM | POA: Diagnosis not present

## 2016-08-21 DIAGNOSIS — R300 Dysuria: Secondary | ICD-10-CM | POA: Diagnosis not present

## 2016-11-15 DIAGNOSIS — R21 Rash and other nonspecific skin eruption: Secondary | ICD-10-CM | POA: Diagnosis not present

## 2016-11-15 DIAGNOSIS — W57XXXA Bitten or stung by nonvenomous insect and other nonvenomous arthropods, initial encounter: Secondary | ICD-10-CM | POA: Diagnosis not present

## 2016-11-15 DIAGNOSIS — S2096XA Insect bite (nonvenomous) of unspecified parts of thorax, initial encounter: Secondary | ICD-10-CM | POA: Diagnosis not present

## 2016-12-02 DIAGNOSIS — M545 Low back pain: Secondary | ICD-10-CM | POA: Diagnosis not present

## 2016-12-13 DIAGNOSIS — T7840XA Allergy, unspecified, initial encounter: Secondary | ICD-10-CM | POA: Diagnosis not present

## 2016-12-13 DIAGNOSIS — L509 Urticaria, unspecified: Secondary | ICD-10-CM | POA: Diagnosis not present

## 2016-12-19 DIAGNOSIS — K5900 Constipation, unspecified: Secondary | ICD-10-CM | POA: Diagnosis not present

## 2016-12-19 DIAGNOSIS — M549 Dorsalgia, unspecified: Secondary | ICD-10-CM | POA: Diagnosis not present

## 2016-12-19 DIAGNOSIS — N3010 Interstitial cystitis (chronic) without hematuria: Secondary | ICD-10-CM | POA: Diagnosis not present

## 2017-01-09 DIAGNOSIS — G43109 Migraine with aura, not intractable, without status migrainosus: Secondary | ICD-10-CM | POA: Diagnosis not present

## 2017-01-09 DIAGNOSIS — Z85850 Personal history of malignant neoplasm of thyroid: Secondary | ICD-10-CM | POA: Diagnosis not present

## 2017-01-09 DIAGNOSIS — F339 Major depressive disorder, recurrent, unspecified: Secondary | ICD-10-CM | POA: Diagnosis not present

## 2017-01-09 DIAGNOSIS — E039 Hypothyroidism, unspecified: Secondary | ICD-10-CM | POA: Diagnosis not present

## 2017-01-15 ENCOUNTER — Other Ambulatory Visit: Payer: Self-pay | Admitting: Obstetrics and Gynecology

## 2017-01-15 DIAGNOSIS — Z01419 Encounter for gynecological examination (general) (routine) without abnormal findings: Secondary | ICD-10-CM | POA: Diagnosis not present

## 2017-01-15 DIAGNOSIS — Z124 Encounter for screening for malignant neoplasm of cervix: Secondary | ICD-10-CM | POA: Diagnosis not present

## 2017-01-15 DIAGNOSIS — Z6826 Body mass index (BMI) 26.0-26.9, adult: Secondary | ICD-10-CM | POA: Diagnosis not present

## 2017-01-15 DIAGNOSIS — Z1231 Encounter for screening mammogram for malignant neoplasm of breast: Secondary | ICD-10-CM | POA: Diagnosis not present

## 2017-01-18 LAB — CYTOLOGY - PAP

## 2017-01-25 DIAGNOSIS — J029 Acute pharyngitis, unspecified: Secondary | ICD-10-CM | POA: Diagnosis not present

## 2017-02-06 DIAGNOSIS — N870 Mild cervical dysplasia: Secondary | ICD-10-CM | POA: Diagnosis not present

## 2017-02-06 DIAGNOSIS — R87612 Low grade squamous intraepithelial lesion on cytologic smear of cervix (LGSIL): Secondary | ICD-10-CM | POA: Diagnosis not present

## 2017-02-06 DIAGNOSIS — Z6826 Body mass index (BMI) 26.0-26.9, adult: Secondary | ICD-10-CM | POA: Diagnosis not present

## 2017-02-21 DIAGNOSIS — R3915 Urgency of urination: Secondary | ICD-10-CM | POA: Diagnosis not present

## 2017-02-21 DIAGNOSIS — R350 Frequency of micturition: Secondary | ICD-10-CM | POA: Diagnosis not present

## 2017-02-21 DIAGNOSIS — N3010 Interstitial cystitis (chronic) without hematuria: Secondary | ICD-10-CM | POA: Diagnosis not present

## 2017-03-22 DIAGNOSIS — R350 Frequency of micturition: Secondary | ICD-10-CM | POA: Diagnosis not present

## 2017-03-22 DIAGNOSIS — R3915 Urgency of urination: Secondary | ICD-10-CM | POA: Diagnosis not present

## 2017-05-25 DIAGNOSIS — B0059 Other herpesviral disease of eye: Secondary | ICD-10-CM | POA: Diagnosis not present

## 2017-09-10 DIAGNOSIS — Z0000 Encounter for general adult medical examination without abnormal findings: Secondary | ICD-10-CM | POA: Diagnosis not present

## 2017-09-10 DIAGNOSIS — E039 Hypothyroidism, unspecified: Secondary | ICD-10-CM | POA: Diagnosis not present

## 2017-09-10 DIAGNOSIS — R399 Unspecified symptoms and signs involving the genitourinary system: Secondary | ICD-10-CM | POA: Diagnosis not present

## 2017-09-10 DIAGNOSIS — Z23 Encounter for immunization: Secondary | ICD-10-CM | POA: Diagnosis not present

## 2017-10-06 IMAGING — MG MM DIAG BREAST TOMO UNI RIGHT
6 of 10 series · 6 of 26 positions shown · non-contrast
Comparison: Previous exam(s).

CLINICAL DATA: 54-year-old female presenting for screening recall
of a possible right breast distortion.

EXAM:
2D DIGITAL DIAGNOSTIC UNILATERAL RIGHT MAMMOGRAM WITH CAD AND
ADJUNCT TOMO

[R CC (1 of 2)]
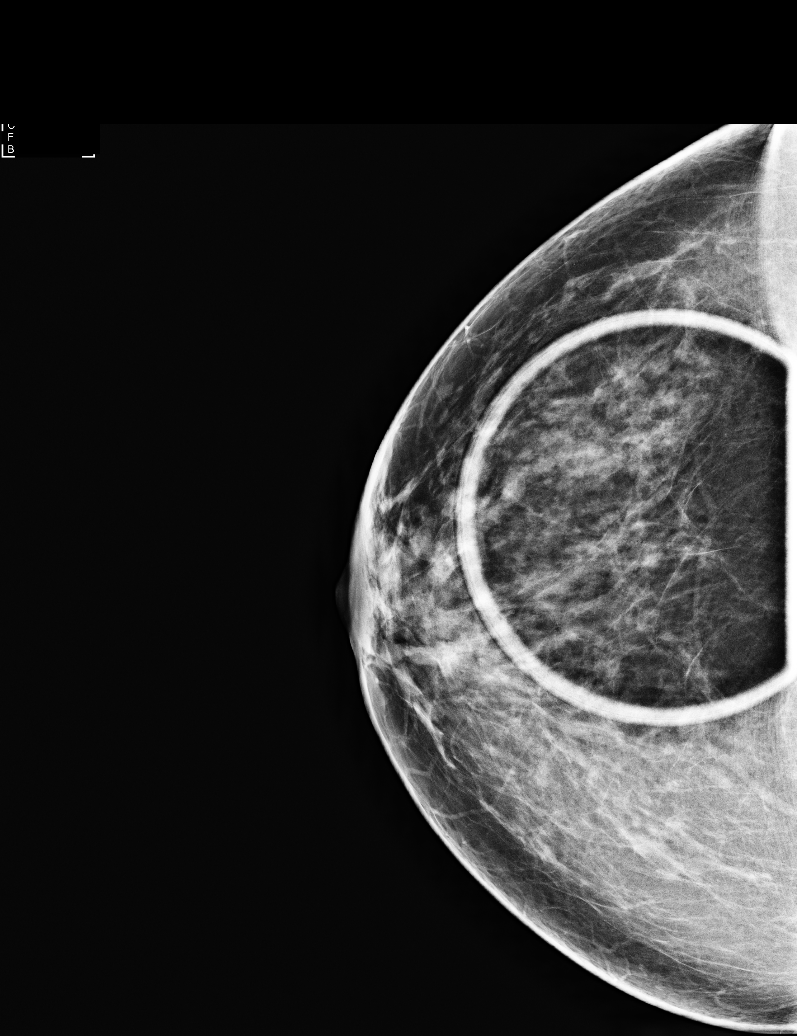

[R MLO synth-2D]
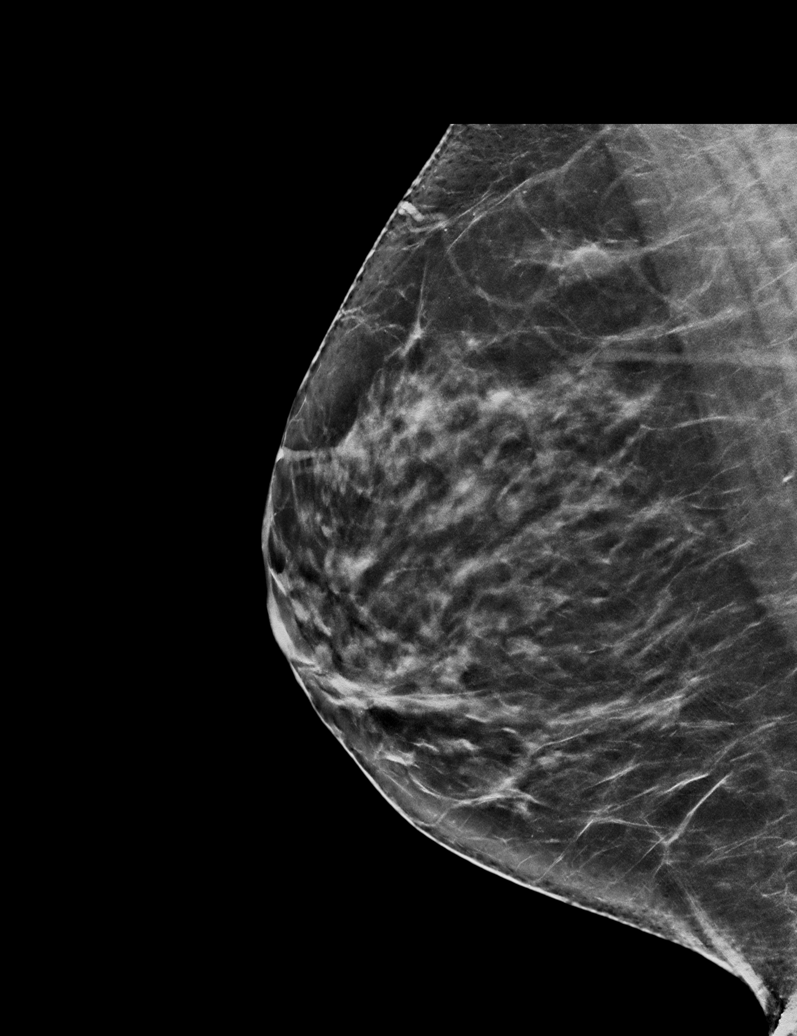

[R CC (2 of 2)]
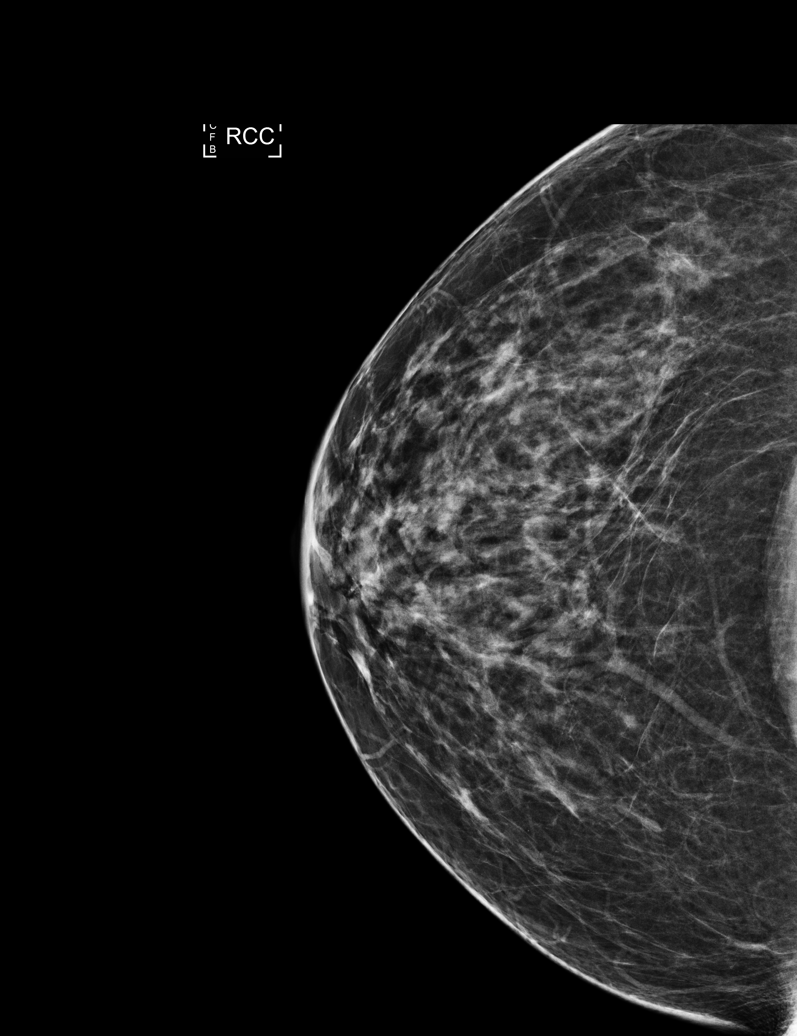

[R ML]
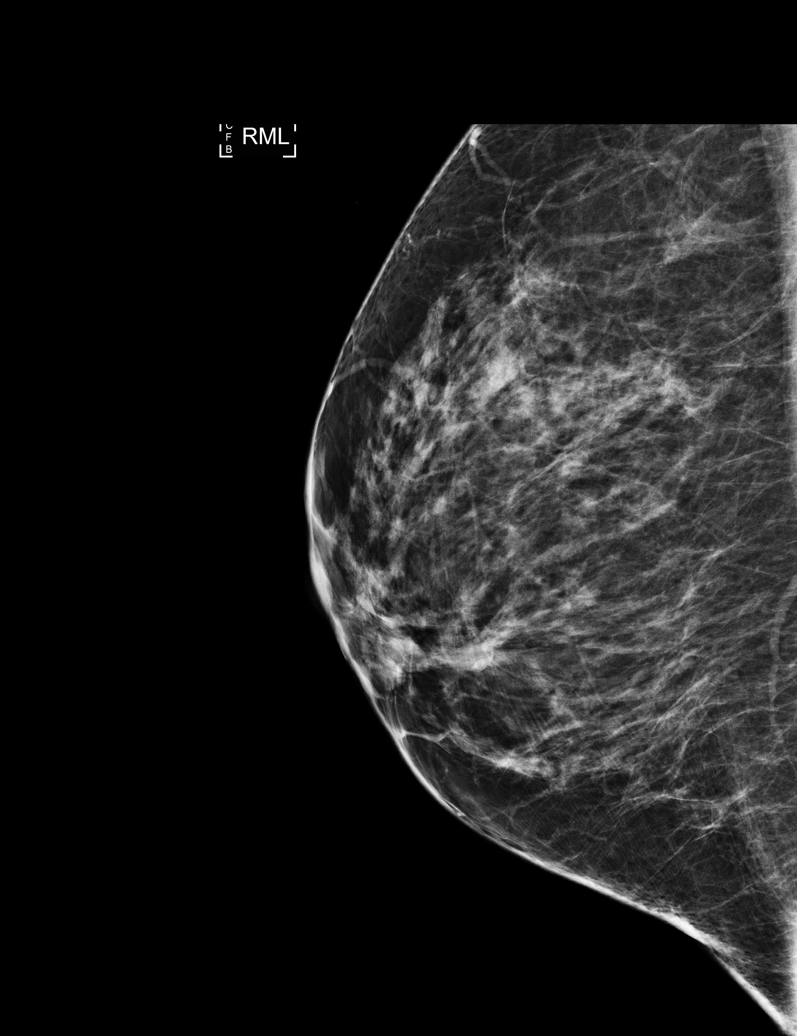

[R MLO]
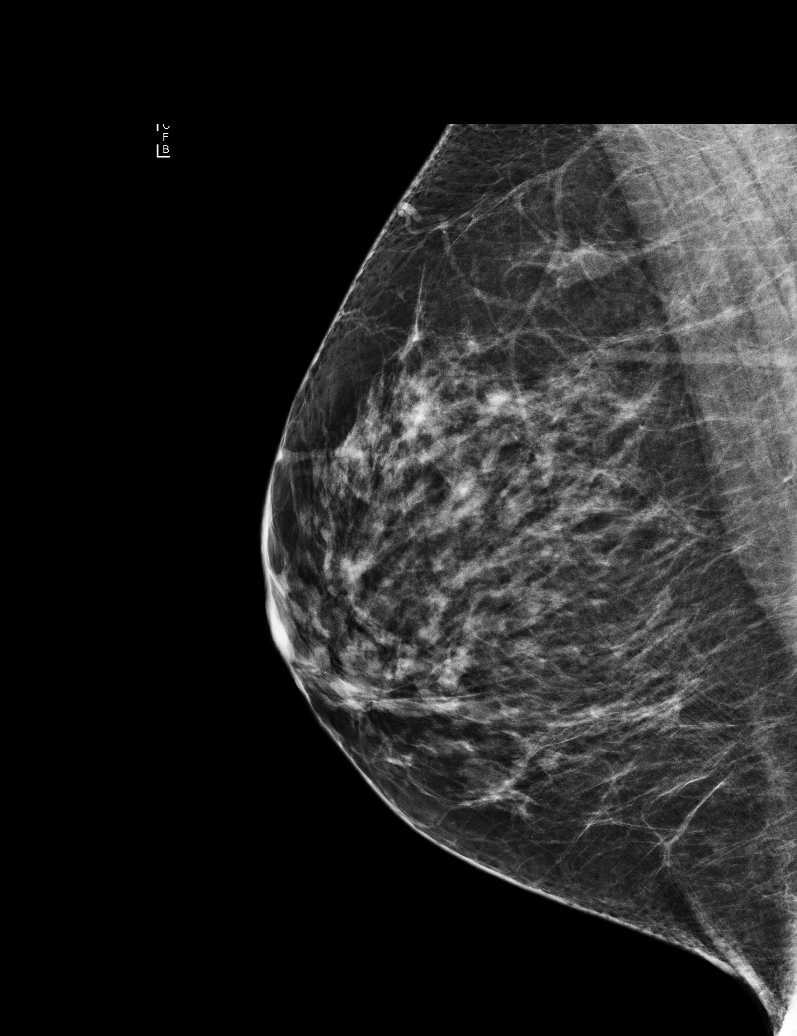

[R CC synth-2D]
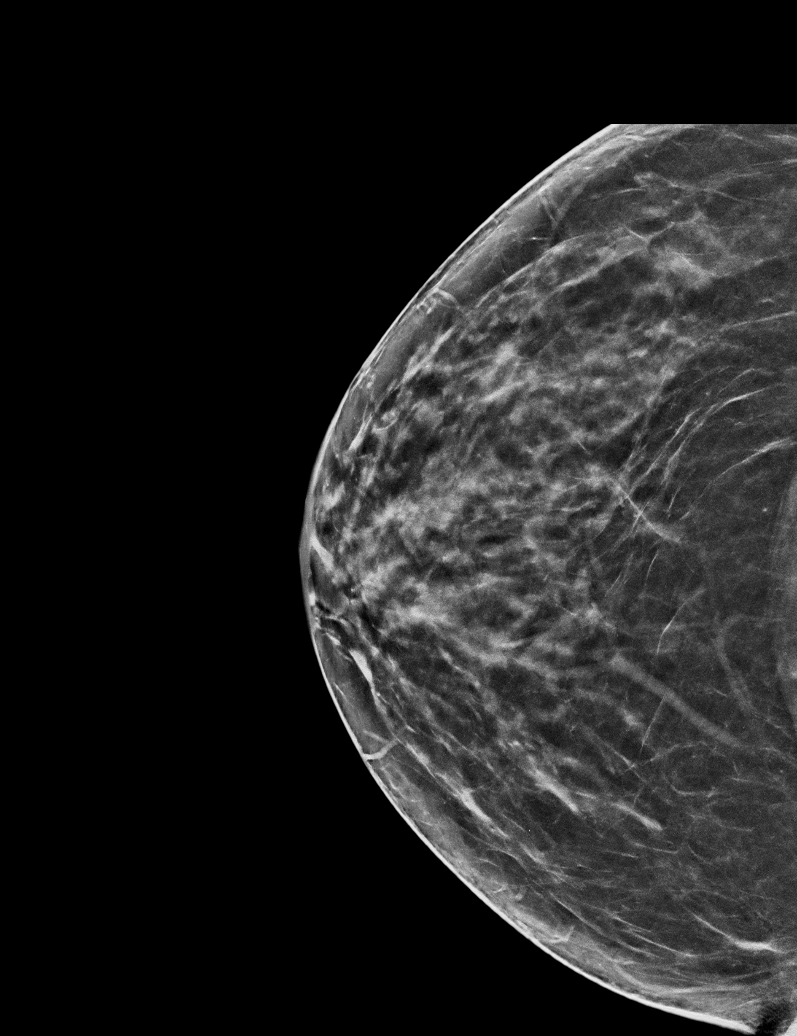

[6 of 26 positions shown; findings below may reference images not displayed]

ACR Breast Density Category c: The breast tissue is heterogeneously
dense, which may obscure small masses.
FINDINGS: The distortion of concern in the central right breast on the CC view
resolves with additional spot compression tomosynthesis images. No
suspicious calcifications, masses or areas of distortion are seen in
the right breast.

Mammographic images were processed with CAD.
IMPRESSION: 1. Interval resolution of the distortion of concern in the central
right breast consistent with overlapping fibroglandular tissue.

2.  No mammographic evidence of malignancy in the right breast.

RECOMMENDATION:
Screening mammogram in one year.(Code:UG-W-AI0)

I have discussed the findings and recommendations with the patient.
Results were also provided in writing at the conclusion of the
visit. If applicable, a reminder letter will be sent to the patient
regarding the next appointment.

BI-RADS CATEGORY  1: Negative.

## 2017-12-07 DIAGNOSIS — E039 Hypothyroidism, unspecified: Secondary | ICD-10-CM | POA: Diagnosis not present

## 2017-12-12 DIAGNOSIS — J019 Acute sinusitis, unspecified: Secondary | ICD-10-CM | POA: Diagnosis not present

## 2017-12-12 DIAGNOSIS — R42 Dizziness and giddiness: Secondary | ICD-10-CM | POA: Diagnosis not present

## 2018-01-02 DIAGNOSIS — R350 Frequency of micturition: Secondary | ICD-10-CM | POA: Diagnosis not present

## 2018-04-01 DIAGNOSIS — Z6827 Body mass index (BMI) 27.0-27.9, adult: Secondary | ICD-10-CM | POA: Diagnosis not present

## 2018-04-01 DIAGNOSIS — R102 Pelvic and perineal pain: Secondary | ICD-10-CM | POA: Diagnosis not present

## 2018-04-02 DIAGNOSIS — Z85850 Personal history of malignant neoplasm of thyroid: Secondary | ICD-10-CM | POA: Diagnosis not present

## 2018-04-02 DIAGNOSIS — E890 Postprocedural hypothyroidism: Secondary | ICD-10-CM | POA: Diagnosis not present

## 2018-04-02 DIAGNOSIS — Z23 Encounter for immunization: Secondary | ICD-10-CM | POA: Diagnosis not present

## 2018-04-02 DIAGNOSIS — E039 Hypothyroidism, unspecified: Secondary | ICD-10-CM | POA: Diagnosis not present

## 2018-04-02 DIAGNOSIS — F339 Major depressive disorder, recurrent, unspecified: Secondary | ICD-10-CM | POA: Diagnosis not present

## 2018-04-02 DIAGNOSIS — G43909 Migraine, unspecified, not intractable, without status migrainosus: Secondary | ICD-10-CM | POA: Diagnosis not present

## 2018-05-01 DIAGNOSIS — J3489 Other specified disorders of nose and nasal sinuses: Secondary | ICD-10-CM | POA: Diagnosis not present

## 2018-05-03 DIAGNOSIS — Z803 Family history of malignant neoplasm of breast: Secondary | ICD-10-CM | POA: Diagnosis not present

## 2018-05-03 DIAGNOSIS — Z124 Encounter for screening for malignant neoplasm of cervix: Secondary | ICD-10-CM | POA: Diagnosis not present

## 2018-05-03 DIAGNOSIS — Z6827 Body mass index (BMI) 27.0-27.9, adult: Secondary | ICD-10-CM | POA: Diagnosis not present

## 2018-05-03 DIAGNOSIS — Z1231 Encounter for screening mammogram for malignant neoplasm of breast: Secondary | ICD-10-CM | POA: Diagnosis not present

## 2018-05-03 DIAGNOSIS — Z85850 Personal history of malignant neoplasm of thyroid: Secondary | ICD-10-CM | POA: Diagnosis not present

## 2018-05-03 DIAGNOSIS — Z800 Family history of malignant neoplasm of digestive organs: Secondary | ICD-10-CM | POA: Diagnosis not present

## 2018-05-03 DIAGNOSIS — Z01419 Encounter for gynecological examination (general) (routine) without abnormal findings: Secondary | ICD-10-CM | POA: Diagnosis not present

## 2018-05-03 DIAGNOSIS — Z8042 Family history of malignant neoplasm of prostate: Secondary | ICD-10-CM | POA: Diagnosis not present

## 2018-06-07 DIAGNOSIS — N3001 Acute cystitis with hematuria: Secondary | ICD-10-CM | POA: Diagnosis not present

## 2018-06-08 DIAGNOSIS — N3001 Acute cystitis with hematuria: Secondary | ICD-10-CM | POA: Diagnosis not present

## 2018-06-18 DIAGNOSIS — Z1371 Encounter for nonprocreative screening for genetic disease carrier status: Secondary | ICD-10-CM | POA: Diagnosis not present

## 2018-07-17 DIAGNOSIS — J019 Acute sinusitis, unspecified: Secondary | ICD-10-CM | POA: Diagnosis not present

## 2018-09-17 DIAGNOSIS — Z1211 Encounter for screening for malignant neoplasm of colon: Secondary | ICD-10-CM | POA: Diagnosis not present

## 2018-09-17 DIAGNOSIS — F339 Major depressive disorder, recurrent, unspecified: Secondary | ICD-10-CM | POA: Diagnosis not present

## 2018-09-17 DIAGNOSIS — Z0000 Encounter for general adult medical examination without abnormal findings: Secondary | ICD-10-CM | POA: Diagnosis not present

## 2018-09-17 DIAGNOSIS — Z131 Encounter for screening for diabetes mellitus: Secondary | ICD-10-CM | POA: Diagnosis not present

## 2018-09-17 DIAGNOSIS — E039 Hypothyroidism, unspecified: Secondary | ICD-10-CM | POA: Diagnosis not present

## 2018-09-17 DIAGNOSIS — Z13220 Encounter for screening for lipoid disorders: Secondary | ICD-10-CM | POA: Diagnosis not present

## 2018-09-17 DIAGNOSIS — G43909 Migraine, unspecified, not intractable, without status migrainosus: Secondary | ICD-10-CM | POA: Diagnosis not present

## 2019-02-19 DIAGNOSIS — N3010 Interstitial cystitis (chronic) without hematuria: Secondary | ICD-10-CM | POA: Diagnosis not present

## 2019-02-19 DIAGNOSIS — R300 Dysuria: Secondary | ICD-10-CM | POA: Diagnosis not present

## 2019-03-24 DIAGNOSIS — B349 Viral infection, unspecified: Secondary | ICD-10-CM | POA: Diagnosis not present

## 2019-03-24 DIAGNOSIS — Z1159 Encounter for screening for other viral diseases: Secondary | ICD-10-CM | POA: Diagnosis not present

## 2019-03-28 DIAGNOSIS — E890 Postprocedural hypothyroidism: Secondary | ICD-10-CM | POA: Diagnosis not present

## 2019-03-28 DIAGNOSIS — G43909 Migraine, unspecified, not intractable, without status migrainosus: Secondary | ICD-10-CM | POA: Diagnosis not present

## 2019-03-28 DIAGNOSIS — Z85850 Personal history of malignant neoplasm of thyroid: Secondary | ICD-10-CM | POA: Diagnosis not present

## 2019-03-28 DIAGNOSIS — F339 Major depressive disorder, recurrent, unspecified: Secondary | ICD-10-CM | POA: Diagnosis not present

## 2019-04-16 DIAGNOSIS — E039 Hypothyroidism, unspecified: Secondary | ICD-10-CM | POA: Diagnosis not present

## 2019-05-28 DIAGNOSIS — R05 Cough: Secondary | ICD-10-CM | POA: Diagnosis not present

## 2019-05-28 DIAGNOSIS — Z20828 Contact with and (suspected) exposure to other viral communicable diseases: Secondary | ICD-10-CM | POA: Diagnosis not present

## 2019-05-29 DIAGNOSIS — J029 Acute pharyngitis, unspecified: Secondary | ICD-10-CM | POA: Diagnosis not present

## 2019-05-29 DIAGNOSIS — R509 Fever, unspecified: Secondary | ICD-10-CM | POA: Diagnosis not present

## 2019-05-29 DIAGNOSIS — R05 Cough: Secondary | ICD-10-CM | POA: Diagnosis not present

## 2019-06-25 ENCOUNTER — Ambulatory Visit
Admission: RE | Admit: 2019-06-25 | Discharge: 2019-06-25 | Disposition: A | Discharge status: Home / Self Care | Payer: BC Managed Care – PPO | Source: Ambulatory Visit | Attending: Physician Assistant | Admitting: Physician Assistant

## 2019-06-25 ENCOUNTER — Other Ambulatory Visit: Payer: Self-pay | Admitting: Physician Assistant

## 2019-06-25 DIAGNOSIS — R05 Cough: Secondary | ICD-10-CM | POA: Diagnosis not present

## 2019-06-25 DIAGNOSIS — R059 Cough, unspecified: Secondary | ICD-10-CM

## 2019-10-08 DIAGNOSIS — R197 Diarrhea, unspecified: Secondary | ICD-10-CM | POA: Diagnosis not present

## 2019-10-08 DIAGNOSIS — R5383 Other fatigue: Secondary | ICD-10-CM | POA: Diagnosis not present

## 2019-10-14 DIAGNOSIS — Z0000 Encounter for general adult medical examination without abnormal findings: Secondary | ICD-10-CM | POA: Diagnosis not present

## 2019-10-14 DIAGNOSIS — F339 Major depressive disorder, recurrent, unspecified: Secondary | ICD-10-CM | POA: Diagnosis not present

## 2019-10-14 DIAGNOSIS — Z85850 Personal history of malignant neoplasm of thyroid: Secondary | ICD-10-CM | POA: Diagnosis not present

## 2019-10-14 DIAGNOSIS — E039 Hypothyroidism, unspecified: Secondary | ICD-10-CM | POA: Diagnosis not present

## 2019-10-14 DIAGNOSIS — E890 Postprocedural hypothyroidism: Secondary | ICD-10-CM | POA: Diagnosis not present

## 2019-10-14 DIAGNOSIS — N3010 Interstitial cystitis (chronic) without hematuria: Secondary | ICD-10-CM | POA: Diagnosis not present

## 2019-11-24 DIAGNOSIS — J40 Bronchitis, not specified as acute or chronic: Secondary | ICD-10-CM | POA: Diagnosis not present

## 2019-11-24 DIAGNOSIS — R05 Cough: Secondary | ICD-10-CM | POA: Diagnosis not present

## 2019-12-10 DIAGNOSIS — L821 Other seborrheic keratosis: Secondary | ICD-10-CM | POA: Diagnosis not present

## 2019-12-10 DIAGNOSIS — X32XXXD Exposure to sunlight, subsequent encounter: Secondary | ICD-10-CM | POA: Diagnosis not present

## 2019-12-10 DIAGNOSIS — L820 Inflamed seborrheic keratosis: Secondary | ICD-10-CM | POA: Diagnosis not present

## 2019-12-10 DIAGNOSIS — D2272 Melanocytic nevi of left lower limb, including hip: Secondary | ICD-10-CM | POA: Diagnosis not present

## 2019-12-10 DIAGNOSIS — L570 Actinic keratosis: Secondary | ICD-10-CM | POA: Diagnosis not present

## 2019-12-10 DIAGNOSIS — D2271 Melanocytic nevi of right lower limb, including hip: Secondary | ICD-10-CM | POA: Diagnosis not present

## 2019-12-10 DIAGNOSIS — L438 Other lichen planus: Secondary | ICD-10-CM | POA: Diagnosis not present

## 2019-12-10 DIAGNOSIS — Z1283 Encounter for screening for malignant neoplasm of skin: Secondary | ICD-10-CM | POA: Diagnosis not present

## 2019-12-12 DIAGNOSIS — H25813 Combined forms of age-related cataract, bilateral: Secondary | ICD-10-CM | POA: Diagnosis not present

## 2020-01-14 DIAGNOSIS — Z01419 Encounter for gynecological examination (general) (routine) without abnormal findings: Secondary | ICD-10-CM | POA: Diagnosis not present

## 2020-01-14 DIAGNOSIS — Z1231 Encounter for screening mammogram for malignant neoplasm of breast: Secondary | ICD-10-CM | POA: Diagnosis not present

## 2020-01-14 DIAGNOSIS — R87810 Cervical high risk human papillomavirus (HPV) DNA test positive: Secondary | ICD-10-CM | POA: Diagnosis not present

## 2020-01-14 DIAGNOSIS — Z124 Encounter for screening for malignant neoplasm of cervix: Secondary | ICD-10-CM | POA: Diagnosis not present

## 2020-01-14 DIAGNOSIS — Z6829 Body mass index (BMI) 29.0-29.9, adult: Secondary | ICD-10-CM | POA: Diagnosis not present

## 2020-01-23 DIAGNOSIS — E7800 Pure hypercholesterolemia, unspecified: Secondary | ICD-10-CM | POA: Diagnosis not present

## 2020-01-23 DIAGNOSIS — E039 Hypothyroidism, unspecified: Secondary | ICD-10-CM | POA: Diagnosis not present

## 2020-02-03 DIAGNOSIS — M7580 Other shoulder lesions, unspecified shoulder: Secondary | ICD-10-CM | POA: Diagnosis not present

## 2020-02-03 DIAGNOSIS — M25512 Pain in left shoulder: Secondary | ICD-10-CM | POA: Diagnosis not present

## 2020-04-01 DIAGNOSIS — R519 Headache, unspecified: Secondary | ICD-10-CM | POA: Diagnosis not present

## 2020-04-01 DIAGNOSIS — J3489 Other specified disorders of nose and nasal sinuses: Secondary | ICD-10-CM | POA: Diagnosis not present

## 2020-04-02 DIAGNOSIS — J3489 Other specified disorders of nose and nasal sinuses: Secondary | ICD-10-CM | POA: Diagnosis not present

## 2020-04-15 DIAGNOSIS — N3281 Overactive bladder: Secondary | ICD-10-CM | POA: Diagnosis not present

## 2020-04-15 DIAGNOSIS — E039 Hypothyroidism, unspecified: Secondary | ICD-10-CM | POA: Diagnosis not present

## 2020-04-15 DIAGNOSIS — E7800 Pure hypercholesterolemia, unspecified: Secondary | ICD-10-CM | POA: Diagnosis not present

## 2020-04-15 DIAGNOSIS — G43909 Migraine, unspecified, not intractable, without status migrainosus: Secondary | ICD-10-CM | POA: Diagnosis not present

## 2020-04-15 DIAGNOSIS — F339 Major depressive disorder, recurrent, unspecified: Secondary | ICD-10-CM | POA: Diagnosis not present

## 2020-04-28 DIAGNOSIS — Z20828 Contact with and (suspected) exposure to other viral communicable diseases: Secondary | ICD-10-CM | POA: Diagnosis not present

## 2020-05-09 DIAGNOSIS — Z20822 Contact with and (suspected) exposure to covid-19: Secondary | ICD-10-CM | POA: Diagnosis not present

## 2020-06-15 DIAGNOSIS — E039 Hypothyroidism, unspecified: Secondary | ICD-10-CM | POA: Diagnosis not present

## 2020-08-18 DIAGNOSIS — M542 Cervicalgia: Secondary | ICD-10-CM | POA: Diagnosis not present

## 2020-08-18 DIAGNOSIS — S46012A Strain of muscle(s) and tendon(s) of the rotator cuff of left shoulder, initial encounter: Secondary | ICD-10-CM | POA: Diagnosis not present

## 2020-08-31 DIAGNOSIS — M25512 Pain in left shoulder: Secondary | ICD-10-CM | POA: Diagnosis not present

## 2020-09-02 DIAGNOSIS — M7582 Other shoulder lesions, left shoulder: Secondary | ICD-10-CM | POA: Diagnosis not present

## 2020-09-23 DIAGNOSIS — Z20822 Contact with and (suspected) exposure to covid-19: Secondary | ICD-10-CM | POA: Diagnosis not present

## 2021-01-17 DIAGNOSIS — H43392 Other vitreous opacities, left eye: Secondary | ICD-10-CM | POA: Diagnosis not present

## 2021-03-22 DIAGNOSIS — Z20822 Contact with and (suspected) exposure to covid-19: Secondary | ICD-10-CM | POA: Diagnosis not present

## 2021-03-29 DIAGNOSIS — Z01419 Encounter for gynecological examination (general) (routine) without abnormal findings: Secondary | ICD-10-CM | POA: Diagnosis not present

## 2021-03-29 DIAGNOSIS — Z1231 Encounter for screening mammogram for malignant neoplasm of breast: Secondary | ICD-10-CM | POA: Diagnosis not present

## 2021-03-29 DIAGNOSIS — Z6829 Body mass index (BMI) 29.0-29.9, adult: Secondary | ICD-10-CM | POA: Diagnosis not present

## 2021-03-29 DIAGNOSIS — R87810 Cervical high risk human papillomavirus (HPV) DNA test positive: Secondary | ICD-10-CM | POA: Diagnosis not present

## 2021-03-29 DIAGNOSIS — Z124 Encounter for screening for malignant neoplasm of cervix: Secondary | ICD-10-CM | POA: Diagnosis not present

## 2021-04-20 DIAGNOSIS — Z6829 Body mass index (BMI) 29.0-29.9, adult: Secondary | ICD-10-CM | POA: Diagnosis not present

## 2021-04-20 DIAGNOSIS — N870 Mild cervical dysplasia: Secondary | ICD-10-CM | POA: Diagnosis not present

## 2021-04-20 DIAGNOSIS — R87810 Cervical high risk human papillomavirus (HPV) DNA test positive: Secondary | ICD-10-CM | POA: Diagnosis not present

## 2021-05-31 DIAGNOSIS — Z20822 Contact with and (suspected) exposure to covid-19: Secondary | ICD-10-CM | POA: Diagnosis not present

## 2021-06-16 DIAGNOSIS — E7800 Pure hypercholesterolemia, unspecified: Secondary | ICD-10-CM | POA: Diagnosis not present

## 2021-06-16 DIAGNOSIS — N3010 Interstitial cystitis (chronic) without hematuria: Secondary | ICD-10-CM | POA: Diagnosis not present

## 2021-06-16 DIAGNOSIS — Z0000 Encounter for general adult medical examination without abnormal findings: Secondary | ICD-10-CM | POA: Diagnosis not present

## 2021-06-16 DIAGNOSIS — F339 Major depressive disorder, recurrent, unspecified: Secondary | ICD-10-CM | POA: Diagnosis not present

## 2021-06-16 DIAGNOSIS — E039 Hypothyroidism, unspecified: Secondary | ICD-10-CM | POA: Diagnosis not present

## 2021-06-16 DIAGNOSIS — Z23 Encounter for immunization: Secondary | ICD-10-CM | POA: Diagnosis not present

## 2021-06-16 DIAGNOSIS — Z85850 Personal history of malignant neoplasm of thyroid: Secondary | ICD-10-CM | POA: Diagnosis not present

## 2021-06-16 DIAGNOSIS — R5383 Other fatigue: Secondary | ICD-10-CM | POA: Diagnosis not present

## 2021-08-30 DIAGNOSIS — F339 Major depressive disorder, recurrent, unspecified: Secondary | ICD-10-CM | POA: Diagnosis not present

## 2021-08-30 DIAGNOSIS — M549 Dorsalgia, unspecified: Secondary | ICD-10-CM | POA: Diagnosis not present

## 2021-08-30 DIAGNOSIS — E039 Hypothyroidism, unspecified: Secondary | ICD-10-CM | POA: Diagnosis not present

## 2021-09-14 DIAGNOSIS — E039 Hypothyroidism, unspecified: Secondary | ICD-10-CM | POA: Diagnosis not present

## 2022-06-20 DIAGNOSIS — E7800 Pure hypercholesterolemia, unspecified: Secondary | ICD-10-CM | POA: Diagnosis not present

## 2024-06-27 DIAGNOSIS — Z1231 Encounter for screening mammogram for malignant neoplasm of breast: Secondary | ICD-10-CM | POA: Diagnosis not present

## 2024-06-27 DIAGNOSIS — Z124 Encounter for screening for malignant neoplasm of cervix: Secondary | ICD-10-CM | POA: Diagnosis not present

## 2024-06-27 DIAGNOSIS — Z01419 Encounter for gynecological examination (general) (routine) without abnormal findings: Secondary | ICD-10-CM | POA: Diagnosis not present

## 2024-07-18 DIAGNOSIS — R928 Other abnormal and inconclusive findings on diagnostic imaging of breast: Secondary | ICD-10-CM
